# Patient Record
Sex: Female | Born: 1980 | Race: White | Hispanic: No | Marital: Single | Smoking: Current every day smoker
Health system: Southern US, Community
[De-identification: ages and names within clinical notes are randomized; demographics above are authoritative.]

## PROBLEM LIST (undated history)

## (undated) DIAGNOSIS — I10 Essential (primary) hypertension: Secondary | ICD-10-CM

## (undated) DIAGNOSIS — F419 Anxiety disorder, unspecified: Secondary | ICD-10-CM

## (undated) DIAGNOSIS — F32A Depression, unspecified: Secondary | ICD-10-CM

## (undated) DIAGNOSIS — F329 Major depressive disorder, single episode, unspecified: Secondary | ICD-10-CM

## (undated) HISTORY — PX: TUBAL LIGATION: SHX77

---

## 2007-07-23 ENCOUNTER — Ambulatory Visit (HOSPITAL_COMMUNITY): Admission: RE | Admit: 2007-07-23 | Discharge: 2007-07-23 | Payer: Self-pay | Admitting: Obstetrics and Gynecology

## 2007-08-20 ENCOUNTER — Ambulatory Visit (HOSPITAL_COMMUNITY): Admission: RE | Admit: 2007-08-20 | Discharge: 2007-08-20 | Payer: Self-pay | Admitting: Obstetrics and Gynecology

## 2015-07-26 ENCOUNTER — Emergency Department (INDEPENDENT_AMBULATORY_CARE_PROVIDER_SITE_OTHER)
Admission: EM | Admit: 2015-07-26 | Discharge: 2015-07-26 | Disposition: A | Discharge status: Home / Self Care | Payer: Medicaid Other | Source: Home / Self Care | Attending: Family Medicine | Admitting: Family Medicine

## 2015-07-26 ENCOUNTER — Encounter: Payer: Self-pay | Admitting: *Deleted

## 2015-07-26 DIAGNOSIS — R1031 Right lower quadrant pain: Secondary | ICD-10-CM | POA: Diagnosis not present

## 2015-07-26 DIAGNOSIS — R300 Dysuria: Secondary | ICD-10-CM

## 2015-07-26 HISTORY — DX: Depression, unspecified: F32.A

## 2015-07-26 HISTORY — DX: Major depressive disorder, single episode, unspecified: F32.9

## 2015-07-26 HISTORY — DX: Anxiety disorder, unspecified: F41.9

## 2015-07-26 HISTORY — DX: Essential (primary) hypertension: I10

## 2015-07-26 LAB — POCT URINALYSIS DIP (MANUAL ENTRY)
Bilirubin, UA: NEGATIVE
Blood, UA: NEGATIVE
Glucose, UA: NEGATIVE
Ketones, POC UA: NEGATIVE
Leukocytes, UA: NEGATIVE
Nitrite, UA: NEGATIVE
Protein Ur, POC: 100 — AB
Spec Grav, UA: >=1.03 (ref 1.005–1.03)
Urobilinogen, UA: 0.2 (ref 0–1)
pH, UA: 5.5 (ref 5–8)

## 2015-07-26 NOTE — ED Provider Notes (Signed)
Filed Vitals:   07/26/15 1733  BP: 130/86  Pulse: 78  Temp: 98 F (36.7 C)  Resp: 16    Pt is a 35yo female c/o gradually worsening severe RLQ abdominal pain for 3 days.  Pt also reports hx of near-syncope this morning due to the pain but denies hitting head or fully passing out. Pain is aching and sharp, 10/10. Hx of ovarian cysts. She has not taken anything at home for pain as she is short on cash and does not have anything at home to take.  Reports nausea yesterday and waking up with sweats last night. Pt still has her appendix and gallbladder. Denies vaginal symptoms but does c/o dysuria.  UA: unremarkable   On exam, pt is tearful. Tenderness to RLQ.    Concern for appendicitis. No CT or U/S available at this time. Recommend pt go to emergency department for further evaluation. Pt accompanied by friend who feels comfortable transporting [facility]. Declined EMS transport. Pt is stable for transport via POV   Junius Finnerrin O'Malley, PA-C 07/26/15 1741

## 2015-07-26 NOTE — ED Notes (Signed)
Pt c/o RT sided pelvic pain and dysuria x 3 days. She reports an possible episode of LOC this morning. She reports pelvic pain with BM.

## 2016-10-04 ENCOUNTER — Emergency Department
Admission: EM | Admit: 2016-10-04 | Discharge: 2016-10-04 | Disposition: A | Discharge status: Home / Self Care | Payer: Self-pay | Source: Home / Self Care | Attending: Family Medicine | Admitting: Family Medicine

## 2016-10-04 ENCOUNTER — Encounter: Payer: Self-pay | Admitting: *Deleted

## 2016-10-04 DIAGNOSIS — L02411 Cutaneous abscess of right axilla: Secondary | ICD-10-CM

## 2016-10-04 MED ORDER — DOXYCYCLINE HYCLATE 100 MG PO CAPS: 100.0000 mg | ORAL_CAPSULE | Freq: Two times a day (BID) | ORAL | 0 refills | Status: DC

## 2016-10-04 MED ORDER — HYDROCODONE-ACETAMINOPHEN 5-325 MG PO TABS: 1.0000 | ORAL_TABLET | Freq: Four times a day (QID) | ORAL | 0 refills | Status: DC | PRN

## 2016-10-04 MED ORDER — KETOROLAC TROMETHAMINE 60 MG/2ML IM SOLN
60.0000 mg | Freq: Once | INTRAMUSCULAR | Status: AC
  Administered 2016-10-04: 60 mg via INTRAMUSCULAR

## 2016-10-04 NOTE — Discharge Instructions (Signed)
Keep present bandage in place until follow-up visit tomorrow.  Keep bandage clean and dry.  May take Ibuprofen 200mg , 4 tabs every 8 hours with food.

## 2016-10-04 NOTE — ED Triage Notes (Signed)
Patient c/o 3 days of developing abscess to right axilla. Denies drainage.

## 2016-10-04 NOTE — ED Provider Notes (Signed)
[facility]    CSN: [id number] Arrival date & time: 10/04/16  1240     History   Chief Complaint Chief Complaint  Patient presents with  . Abscess    HPI Dawn Avery is a 36 y.o. female.   Patient complains of 3 day history of tender swollen nodule in right axilla.  There has been no drainage.  No fevers, chills, and sweats.   The history is provided by the patient.  Abscess  Abscess location: left axilla. Size:  2cm diameter Abscess quality: fluctuance, painful and redness   Abscess quality: not draining   Duration:  3 days Progression:  Worsening Pain details:    Quality:  Pressure and dull   Severity:  Moderate   Duration:  3 days   Timing:  Constant   Progression:  Worsening Chronicity:  New Context: not skin injury   Relieved by:  Nothing Exacerbated by: arm movement. Ineffective treatments:  None tried Associated symptoms: no anorexia, no fatigue, no fever and no nausea     Past Medical History:  Diagnosis Date  . Anxiety   . Depression   . Hypertension     There are no active problems to display for this patient.   Past Surgical History:  Procedure Laterality Date  . TUBAL LIGATION      OB History    No data available       Home Medications    Prior to Admission medications   Medication Sig Start Date End Date Taking? Authorizing Provider  doxycycline (VIBRAMYCIN) 100 MG capsule Take 1 capsule (100 mg total) by mouth 2 (two) times daily. Take with food. 10/04/16   Lattie HawBeese, Zayden Hahne A, MD  HYDROcodone-acetaminophen (NORCO/VICODIN) 5-325 MG tablet Take 1 tablet by mouth every 6 (six) hours as needed for moderate pain. 10/04/16   Lattie HawBeese, Timmia Cogburn A, MD    Family History Family History  Problem Relation Age of Onset  . Heart disease Father   . Stroke Father   . Diabetes Mother   . Heart failure Mother     Social History Social History  Substance Use Topics  . Smoking status: Current Every Day Smoker    Packs/day: 1.00   Types: Cigarettes  . Smokeless tobacco: Never Used  . Alcohol use No     Allergies   Patient has no known allergies.   Review of Systems Review of Systems  Constitutional: Negative for fatigue and fever.  Gastrointestinal: Negative for anorexia and nausea.  All other systems reviewed and are negative.    Physical Exam Triage Vital Signs ED Triage Vitals [10/04/16 1307]  Enc Vitals Group     BP 126/85     Pulse Rate 68     Resp      Temp 98.7 F (37.1 C)     Temp Source Oral     SpO2 98 %     Weight 125 lb (56.7 kg)     Height      Head Circumference      Peak Flow      Pain Score 7     Pain Loc      Pain Edu?      Excl. in GC?    No data found.   Updated Vital Signs BP 126/85 (BP Location: Left Arm)   Pulse 68   Temp 98.7 F (37.1 C) (Oral)   Wt 125 lb (56.7 kg)   LMP 09/13/2016 (Approximate)   SpO2 98%   BMI 22.86 kg/m  Visual Acuity Right Eye Distance:   Left Eye Distance:   Bilateral Distance:    Right Eye Near:   Left Eye Near:    Bilateral Near:     Physical Exam Nursing notes and Vital Signs reviewed. Appearance:  Patient appears stated age, and in no acute distress.    Eyes:  Pupils are equal, round, and reactive to light and accomodation.  Extraocular movement is intact.  Conjunctivae are not inflamed   Pharynx:  Normal; moist mucous membranes  Neck:  Supple.  No adenopathy Lungs:  Normal inspiration Heart:  Normal rate.   Skin:  Right axilla:  2cm diameter tender, swollen, fluctuant, erythematous cystic lesion.  UC Treatments / Results  Labs (all labs ordered are listed, but only abnormal results are displayed) Labs Reviewed - No data to display  EKG  EKG Interpretation None       Radiology No results found.  Procedures Procedures  Incise and drain cyst/abscess right axilla. Risks and benefits of procedure explained to patient and verbal consent obtained.  Using sterile technique, topical refrigerant spray, and local  anesthesia with 1% lidocaine with epinephrine, cleansed affected area with Betadine and alcohol. Identified the most fluctuant area of lesion and incised with #11 blade.  Expressed blood and purulent material.  Inserted Iodoform gauze packing.  Bandage applied.  Patient tolerated well   Medications Ordered in UC Medications  ketorolac (TORADOL) injection 60 mg (not administered)     Initial Impression / Assessment and Plan / UC Course  I have reviewed the triage vital signs and the nursing notes.  Pertinent labs & imaging results that were available during my care of the patient were reviewed by me and considered in my medical decision making (see chart for details).    Wound culture pending.  Begin doxycycline 100mg  BID for staph coverage. Administered Toradol 60mg  IM  Rx for Lortab (#12, no ref) Keep present bandage in place until follow-up visit tomorrow.  Keep bandage clean and dry.  May take Ibuprofen 200mg , 4 tabs every 8 hours with food.    Controlled Substance Prescriptions I have consulted the [employer] Controlled Substances Registry for this patient, and feel the risk/benefit ratio today is favorable for proceeding with this prescription for a controlled substance.   Final Clinical Impressions(s) / UC Diagnoses   Final diagnoses:  Abscess of axilla, right    New Prescriptions New Prescriptions   DOXYCYCLINE (VIBRAMYCIN) 100 MG CAPSULE    Take 1 capsule (100 mg total) by mouth 2 (two) times daily. Take with food.   HYDROCODONE-ACETAMINOPHEN (NORCO/VICODIN) 5-325 MG TABLET    Take 1 tablet by mouth every 6 (six) hours as needed for moderate pain.     Lattie HawBeese, Elisabella Hacker A, MD 10/14/16 [phone]

## 2016-10-05 ENCOUNTER — Emergency Department (INDEPENDENT_AMBULATORY_CARE_PROVIDER_SITE_OTHER)
Admission: EM | Admit: 2016-10-05 | Discharge: 2016-10-05 | Disposition: A | Discharge status: Home / Self Care | Payer: Self-pay | Source: Home / Self Care | Attending: Family Medicine | Admitting: Family Medicine

## 2016-10-05 ENCOUNTER — Encounter: Payer: Self-pay | Admitting: Emergency Medicine

## 2016-10-05 DIAGNOSIS — Z4801 Encounter for change or removal of surgical wound dressing: Secondary | ICD-10-CM

## 2016-10-05 NOTE — ED Triage Notes (Signed)
Patient presents for wound check to axilla region

## 2016-10-05 NOTE — Discharge Instructions (Signed)
Change dressing daily until healed.  May apply heating pad several times daily.  Finish antibiotic.

## 2016-10-05 NOTE — ED Provider Notes (Signed)
[facility]    CSN: [id number] Arrival date & time: 10/05/16  1617     History   Chief Complaint Chief Complaint  Patient presents with  . Wound Check    HPI Robinette HainesMelissa Avery is a 36 y.o. female.   Patient returns for wound check I and D site right axilla.  She has no complaints.  She reports decreased pain.   The history is provided by the patient.    Past Medical History:  Diagnosis Date  . Anxiety   . Depression   . Hypertension     There are no active problems to display for this patient.   Past Surgical History:  Procedure Laterality Date  . TUBAL LIGATION      OB History    No data available       Home Medications    Prior to Admission medications   Medication Sig Start Date End Date Taking? Authorizing Provider  doxycycline (VIBRAMYCIN) 100 MG capsule Take 1 capsule (100 mg total) by mouth 2 (two) times daily. Take with food. 10/04/16   Lattie HawBeese, Stephen A, MD  HYDROcodone-acetaminophen (NORCO/VICODIN) 5-325 MG tablet Take 1 tablet by mouth every 6 (six) hours as needed for moderate pain. 10/04/16   Lattie HawBeese, Stephen A, MD    Family History Family History  Problem Relation Age of Onset  . Heart disease Father   . Stroke Father   . Diabetes Mother   . Heart failure Mother     Social History Social History  Substance Use Topics  . Smoking status: Current Every Day Smoker    Packs/day: 1.00    Types: Cigarettes  . Smokeless tobacco: Never Used  . Alcohol use No     Allergies   Patient has no known allergies.   Review of Systems Review of Systems  No fevers, chills, and sweats   Physical Exam Triage Vital Signs ED Triage Vitals [10/05/16 1636]  Enc Vitals Group     BP (!) 126/54     Pulse Rate 65     Resp      Temp 98 F (36.7 C)     Temp Source Oral     SpO2 100 %     Weight 125 lb (56.7 kg)     Height 5\' 3"  (1.6 m)     Head Circumference      Peak Flow      Pain Score 6     Pain Loc      Pain Edu?      Excl. in GC?     No data found.   Updated Vital Signs BP (!) 126/54 (BP Location: Left Arm)   Pulse 65   Temp 98 F (36.7 C) (Oral)   Ht 5\' 3"  (1.6 m)   Wt 125 lb (56.7 kg)   LMP 09/13/2016 (Approximate)   SpO2 100%   BMI 22.14 kg/m   Visual Acuity Right Eye Distance:   Left Eye Distance:   Bilateral Distance:    Right Eye Near:   Left Eye Near:    Bilateral Near:     Physical Exam Nursing notes and Vital Signs reviewed. Appearance:  Patient appears stated age, and in no acute distress.    Eyes:  Pupils are equal, round, and reactive to light and accomodation.  Extraocular movement is intact.  Conjunctivae are not inflamed   Neck:  Supple.  No adenopathy Lungs:  Normal respiration. Heart:  Normal rate.   Skin:  Right axilla.  Removed packing from I and D site.  Minimal purulent drainage.  Wound shallow.  Decreased tenderness, and erythema resolved.   UC Treatments / Results  Labs (all labs ordered are listed, but only abnormal results are displayed) Labs Reviewed - No data to display  EKG  EKG Interpretation None       Radiology No results found.  Procedures Procedures (including critical care time)  Medications Ordered in UC Medications - No data to display   Initial Impression / Assessment and Plan / UC Course  I have reviewed the triage vital signs and the nursing notes.  Pertinent labs & imaging results that were available during my care of the patient were reviewed by me and considered in my medical decision making (see chart for details).    No further packing indicated. Bandage applied. Change dressing daily until healed.  May apply heating pad several times daily.  Finish antibiotic. Return for worsening symptoms     Final Clinical Impressions(s) / UC Diagnoses   Final diagnoses:  Dressing change or removal, surgical wound    New Prescriptions New Prescriptions   No medications on file     Lattie HawBeese, Stephen A, MD 10/14/16 1001

## 2016-10-07 ENCOUNTER — Telehealth: Payer: Self-pay

## 2016-10-07 LAB — WOUND CULTURE

## 2016-10-07 NOTE — Telephone Encounter (Signed)
Called pt with lab results and to continue meds as prescribed.  Will follow up as needed.

## 2017-01-21 ENCOUNTER — Encounter: Payer: Self-pay | Admitting: Family Medicine

## 2017-01-21 ENCOUNTER — Ambulatory Visit (INDEPENDENT_AMBULATORY_CARE_PROVIDER_SITE_OTHER): Payer: Self-pay | Admitting: Family Medicine

## 2017-01-21 ENCOUNTER — Ambulatory Visit (INDEPENDENT_AMBULATORY_CARE_PROVIDER_SITE_OTHER): Payer: Self-pay

## 2017-01-21 ENCOUNTER — Other Ambulatory Visit (HOSPITAL_BASED_OUTPATIENT_CLINIC_OR_DEPARTMENT_OTHER): Payer: Self-pay

## 2017-01-21 ENCOUNTER — Ambulatory Visit (HOSPITAL_BASED_OUTPATIENT_CLINIC_OR_DEPARTMENT_OTHER)
Admission: RE | Admit: 2017-01-21 | Discharge: 2017-01-21 | Disposition: A | Discharge status: Home / Self Care | Payer: Self-pay | Source: Ambulatory Visit | Attending: Family Medicine | Admitting: Family Medicine

## 2017-01-21 VITALS — BP 143/94 | HR 80 | Temp 98.5°F | Resp 16 | Wt 126.0 lb

## 2017-01-21 DIAGNOSIS — M79604 Pain in right leg: Secondary | ICD-10-CM

## 2017-01-21 MED ORDER — DICLOFENAC SODIUM 1 % TD GEL: 4.0000 g | Freq: Four times a day (QID) | TRANSDERMAL | 11 refills | Status: AC

## 2017-01-21 MED ORDER — NAPROXEN 500 MG PO TABS: 500.0000 mg | ORAL_TABLET | Freq: Two times a day (BID) | ORAL | 0 refills | Status: AC

## 2017-01-21 MED ORDER — PREDNISONE 5 MG (48) PO TBPK: ORAL_TABLET | ORAL | 0 refills | Status: AC

## 2017-01-21 NOTE — Progress Notes (Signed)
Dawn HainesMelissa Avery is a 36 y.o. female who presents to [facility] today for right leg pain and swelling. Dawn KaufmannMelissa has a two-month history of right low back pain radiating to the right leg. This has been improving recently. She notes the back pain has resolved as has most of the leg pain. She notes a several day history of pain starting at about the anterior proximal tibia radiating to the anterior tibia down to the anterior ankle. She denies any injury or other radiating pain weakness or numbness. She thinks her leg is a bit swollen and is worried about a blood clot. She notes a positive family history for DVT. She denies any chest pain or trouble breathing. She denies fevers or chills vomiting or diarrhea. She's not tried much treatment aside from some over-the-counter medications. She feels well otherwise.   Past Medical History:  Diagnosis Date  . Anxiety   . Depression   . Hypertension    Past Surgical History:  Procedure Laterality Date  . TUBAL LIGATION     Social History   Tobacco Use  . Smoking status: Current Every Day Smoker    Packs/day: 0.50    Types: Cigarettes  . Smokeless tobacco: Never Used  Substance Use Topics  . Alcohol use: No     ROS:  As above   Medications: No current outpatient medications on file.   No current facility-administered medications for this visit.    Allergies  Allergen Reactions  . Cephalexin Rash     Exam:  BP (!) 143/94   Pulse 80   Temp 98.5 F (36.9 C) (Oral)   Resp 16   Wt 126 lb (57.2 kg)   SpO2 100%   BMI 22.32 kg/m  General: Well Developed, well nourished, and in no acute distress.  Neuro/Psych: Alert and oriented x3, extra-ocular muscles intact, able to move all 4 extremities, sensation grossly intact. Skin: Warm and dry, no rashes noted.  Respiratory: Not using accessory muscles, speaking in full sentences, trachea midline.  Cardiovascular: Pulses palpable, no extremity  edema. Abdomen: Does not appear distended. MSK:  Right knee normal-appearing without effusion. No skin change. Tender to palpation anterior and medial knee. Knee range of motion is intact 0-120. Stable ligamentous exam. Negative McMurray's test.  The right calf is normal appearing with no palpable cords or varicose veins. Patient is tender to palpation along the anterior tibia.  She is a small tender nodule that is firm at the medial anterior distal tibia just proximal to the anterior portion of the medial malleolus.     No results found for this or any previous visit (from the past 48 hour(s)). Dg Tibia/fibula Right  Result Date: 01/21/2017 CLINICAL DATA:  Pain and bruising in the right tibia and fibula, no known injury EXAM: RIGHT TIBIA AND FIBULA - 2 VIEW COMPARISON:  None. FINDINGS: No fracture is seen. The right tibia and fibula are in normal alignment. What is visualized of the right knee joint space and right ankle joint appears normal. IMPRESSION: Negative. Electronically Signed   By: Dwyane DeePaul  Barry M.D.   On: 01/21/2017 17:00   [place] Venous Img Lower Unilateral Right  Result Date: 01/21/2017 CLINICAL DATA:  Right lower extremity pain and edema for the past 2 months. History of smoking. Evaluate for DVT EXAM: RIGHT LOWER EXTREMITY VENOUS DOPPLER ULTRASOUND TECHNIQUE: Gray-scale sonography with graded compression, as well as color Doppler and duplex ultrasound were performed to evaluate the lower extremity deep venous systems  from the level of the common femoral vein and including the common femoral, femoral, profunda femoral, popliteal and calf veins including the posterior tibial, peroneal and gastrocnemius veins when visible. The superficial great saphenous vein was also interrogated. Spectral Doppler was utilized to evaluate flow at rest and with distal augmentation maneuvers in the common femoral, femoral and popliteal veins. COMPARISON:  None. FINDINGS: Contralateral Common Femoral  Vein: Respiratory phasicity is normal and symmetric with the symptomatic side. No evidence of thrombus. Normal compressibility. Common Femoral Vein: No evidence of thrombus. Normal compressibility, respiratory phasicity and response to augmentation. Saphenofemoral Junction: No evidence of thrombus. Normal compressibility and flow on color Doppler imaging. Profunda Femoral Vein: No evidence of thrombus. Normal compressibility and flow on color Doppler imaging. Femoral Vein: No evidence of thrombus. Normal compressibility, respiratory phasicity and response to augmentation. Popliteal Vein: No evidence of thrombus. Normal compressibility, respiratory phasicity and response to augmentation. Calf Veins: No evidence of thrombus. Normal compressibility and flow on color Doppler imaging. Superficial Great Saphenous Vein: No evidence of thrombus. Normal compressibility. Venous Reflux:  None. Other Findings: There is a ill-defined punctate (approximately 0.4 x 0.3 x 0.4 cm) isoechoic structure within the subcutaneous tissues which correlates with the patient's palpable area of concern involving the anteromedial aspect the right lower leg. This structure does not definitely communicate with the overlying dermal surface or any adjacent vasculature. IMPRESSION: 1. No evidence of DVT within right lower extremity 2. Punctate (approximately 0.4 cm) isoechoic structure correlates with the patient's palpable area of concern - while indeterminate on this examination, this structure may represent a sebaceous cyst. Clinical correlation is advised. Electronically Signed   By: Simonne ComeJohn  Watts M.D.   On: 01/21/2017 15:24      Assessment and Plan: 36 y.o. female with right leg pain unclear etiology. No obvious fracture or DVT. Symptoms likeley radicular vs knee mensicus issue. Plan for prednisone dose pack followed by naproxen PRN and voltaren gel.  Will recheck in a few weeks.     Orders Placed This Encounter  Procedures  . US  Venous Img Lower Unilateral Right    Standing Status:   Future    Number of Occurrences:   1    Standing Expiration Date:   03/23/2018    Order Specific Question:   Reason for Exam (SYMPTOM  OR DIAGNOSIS REQUIRED)    Answer:   eval right leg swelling    Order Specific Question:   Preferred imaging location?    Answer[facility]  . DG Tibia/Fibula Right    Standing Status:   Future    Number of Occurrences:   1    Standing Expiration Date:   03/23/2018    Order Specific Question:   Reason for Exam (SYMPTOM  OR DIAGNOSIS REQUIRED)    Answer:   eval anterior tib pain    Order Specific Question:   Is patient pregnant?    Answer:   No    Order Specific Question:   Preferred imaging location?    Answer:   [facility]    Order Specific Question:   Radiology Contrast Protocol - do NOT remove file path    Answer:   [email or web address]   No orders of the defined types were placed in this encounter.   Discussed warning signs or symptoms. Please see discharge instructions. Patient expresses understanding.

## 2018-11-03 ENCOUNTER — Emergency Department (HOSPITAL_COMMUNITY)
Admission: EM | Admit: 2018-11-03 | Discharge: 2018-11-03 | Disposition: A | Discharge status: Home / Self Care | Payer: Self-pay | Attending: Emergency Medicine | Admitting: Emergency Medicine

## 2018-11-03 ENCOUNTER — Emergency Department (HOSPITAL_COMMUNITY): Payer: Self-pay

## 2018-11-03 ENCOUNTER — Encounter (HOSPITAL_COMMUNITY): Payer: Self-pay | Admitting: Emergency Medicine

## 2018-11-03 ENCOUNTER — Emergency Department (HOSPITAL_COMMUNITY): Admission: EM | Admit: 2018-11-03 | Discharge: 2018-11-03 | Discharge status: Against Medical Advice | Payer: Self-pay

## 2018-11-03 ENCOUNTER — Other Ambulatory Visit: Payer: Self-pay

## 2018-11-03 DIAGNOSIS — N858 Other specified noninflammatory disorders of uterus: Secondary | ICD-10-CM | POA: Insufficient documentation

## 2018-11-03 DIAGNOSIS — Z79899 Other long term (current) drug therapy: Secondary | ICD-10-CM | POA: Insufficient documentation

## 2018-11-03 DIAGNOSIS — R9389 Abnormal findings on diagnostic imaging of other specified body structures: Secondary | ICD-10-CM

## 2018-11-03 DIAGNOSIS — Z113 Encounter for screening for infections with a predominantly sexual mode of transmission: Secondary | ICD-10-CM | POA: Insufficient documentation

## 2018-11-03 DIAGNOSIS — N730 Acute parametritis and pelvic cellulitis: Secondary | ICD-10-CM

## 2018-11-03 DIAGNOSIS — F17210 Nicotine dependence, cigarettes, uncomplicated: Secondary | ICD-10-CM | POA: Insufficient documentation

## 2018-11-03 DIAGNOSIS — N83519 Torsion of ovary and ovarian pedicle, unspecified side: Secondary | ICD-10-CM

## 2018-11-03 DIAGNOSIS — N739 Female pelvic inflammatory disease, unspecified: Secondary | ICD-10-CM | POA: Insufficient documentation

## 2018-11-03 DIAGNOSIS — R102 Pelvic and perineal pain: Secondary | ICD-10-CM | POA: Insufficient documentation

## 2018-11-03 LAB — CBC
HCT: 42.3 % (ref 36.0–46.0)
Hemoglobin: 13.9 g/dL (ref 12.0–15.0)
MCH: 28.1 pg (ref 26.0–34.0)
MCHC: 32.9 g/dL (ref 30.0–36.0)
MCV: 85.5 fL (ref 80.0–100.0)
Platelets: 234 10*3/uL (ref 150–400)
RBC: 4.95 MIL/uL (ref 3.87–5.11)
RDW: 14.2 % (ref 11.5–15.5)
WBC: 10.6 10*3/uL — ABNORMAL HIGH (ref 4.0–10.5)
nRBC: 0 % (ref 0.0–0.2)

## 2018-11-03 LAB — COMPREHENSIVE METABOLIC PANEL
ALT: 16 U/L (ref 0–44)
AST: 19 U/L (ref 15–41)
Albumin: 4.3 g/dL (ref 3.5–5.0)
Alkaline Phosphatase: 66 U/L (ref 38–126)
Anion gap: 8 (ref 5–15)
BUN: 13 mg/dL (ref 6–20)
CO2: 21 mmol/L — ABNORMAL LOW (ref 22–32)
Calcium: 9.1 mg/dL (ref 8.9–10.3)
Chloride: 108 mmol/L (ref 98–111)
Creatinine, Ser: 0.79 mg/dL (ref 0.44–1.00)
GFR calc Af Amer: >60 mL/min (ref >60)
GFR calc non Af Amer: >60 mL/min (ref >60)
Glucose, Bld: 107 mg/dL — ABNORMAL HIGH (ref 70–99)
Potassium: 3.9 mmol/L (ref 3.5–5.1)
Sodium: 137 mmol/L (ref 135–145)
Total Bilirubin: 0.2 mg/dL — ABNORMAL LOW (ref 0.3–1.2)
Total Protein: 8 g/dL (ref 6.5–8.1)

## 2018-11-03 LAB — URINALYSIS, MICROSCOPIC (REFLEX): WBC, UA: >50 WBC/hpf (ref 0–5)

## 2018-11-03 LAB — HIV ANTIBODY (ROUTINE TESTING W REFLEX): HIV Screen 4th Generation wRfx: NONREACTIVE

## 2018-11-03 LAB — URINALYSIS, ROUTINE W REFLEX MICROSCOPIC
Bilirubin Urine: NEGATIVE
Glucose, UA: NEGATIVE mg/dL
Ketones, ur: NEGATIVE mg/dL
Nitrite: NEGATIVE
Protein, ur: 100 mg/dL — AB
Specific Gravity, Urine: 1.025 (ref 1.005–1.030)
pH: 7 (ref 5.0–8.0)

## 2018-11-03 LAB — I-STAT BETA HCG BLOOD, ED (MC, WL, AP ONLY): I-stat hCG, quantitative: <5 m[IU]/mL (ref <5)

## 2018-11-03 LAB — WET PREP, GENITAL
Sperm: NONE SEEN
Trich, Wet Prep: NONE SEEN
Yeast Wet Prep HPF POC: NONE SEEN

## 2018-11-03 LAB — RPR: RPR Ser Ql: NONREACTIVE

## 2018-11-03 LAB — LIPASE, BLOOD: Lipase: 27 U/L (ref 11–51)

## 2018-11-03 MED ORDER — METRONIDAZOLE 500 MG PO TABS: 500.0000 mg | ORAL_TABLET | Freq: Two times a day (BID) | ORAL | 0 refills | Status: AC

## 2018-11-03 MED ORDER — DOXYCYCLINE HYCLATE 100 MG PO CAPS: 100.0000 mg | ORAL_CAPSULE | Freq: Two times a day (BID) | ORAL | 0 refills | Status: AC

## 2018-11-03 MED ORDER — CEFTRIAXONE SODIUM 250 MG IJ SOLR
250.0000 mg | Freq: Once | INTRAMUSCULAR | Status: AC
  Administered 2018-11-03: 250 mg via INTRAMUSCULAR
  Filled 2018-11-03: qty 250

## 2018-11-03 MED ORDER — ONDANSETRON HCL 4 MG/2ML IJ SOLN
4.0000 mg | Freq: Once | INTRAMUSCULAR | Status: AC
  Administered 2018-11-03: 4 mg via INTRAVENOUS
  Filled 2018-11-03: qty 2

## 2018-11-03 MED ORDER — MORPHINE SULFATE (PF) 4 MG/ML IV SOLN
4.0000 mg | Freq: Once | INTRAVENOUS | Status: AC
  Administered 2018-11-03: 4 mg via INTRAVENOUS
  Filled 2018-11-03: qty 1

## 2018-11-03 MED ORDER — STERILE WATER FOR INJECTION IJ SOLN
INTRAMUSCULAR | Status: AC
  Administered 2018-11-03: 10 mL
  Filled 2018-11-03: qty 10

## 2018-11-03 NOTE — ED Notes (Signed)
No answer for triage x2 

## 2018-11-03 NOTE — Discharge Instructions (Addendum)
You were seen in the ED today for pain in your lower abdomen.  Pelvic exam showed that you have bacterial vaginosis.  It is also expected that you have PID given the amount of discomfort you had during the exam.  We have given you medication to treat gonorrhea in the ED and I have prescribed additional antibiotics for you to treat both PID and bacterial vaginosis.   You may take 600 mg Ibuprofen every 6-8 hours as needed for the pain as well as Tylenol 1,000 mg every 8 hours for the pain.   A pelvic ultrasound was obtained during her ED visit today.  It did show some abnormality in your uterus.  It is recommended that you follow-up with OB/GYN for further evaluation.  [facility].

## 2018-11-03 NOTE — ED Notes (Signed)
Pt called x 1 no answer

## 2018-11-03 NOTE — ED Triage Notes (Signed)
Pt reports lower abd pains that started last night and intensified as the night went on. Pt reports some pusy and bloody urine. Pt reports a hx of kidney stones.

## 2018-11-03 NOTE — ED Provider Notes (Signed)
[facility] EMERGENCY DEPARTMENT Provider Note   CSN: [id number] Arrival date & time: 11/03/18  0730     History   Chief Complaint Chief Complaint  Patient presents with   Abdominal Pain    Lower abd    HPI Dawn Avery is a 38 y.o. female with PMHx anxiety and depression who presents to the ED today noting a sudden onset, constant, achy, suprapubic abdominal pain that began last night and worsened this morning.  Patient reports history of kidney stones and states that this feels similar.  She states that she was in so much pain she was laying in the bathtub and felt like she needed to "push "kidney stone out.  Patient states that she pushed onto her suprapubic area and noticed some bloody mucous in the water.  Has not been taking anything for the pain.  Also endorses nausea but no emesis.  Had tubal ligation proximately 11 years ago.  She is sexually active with one female partner.  Patient denies fever, chills, vomiting, diarrhea, constipation, melena, hematochezia, dysuria, urinary frequency, pelvic pain. LNMP 1 week ago.      Past Medical History:  Diagnosis Date   Anxiety    Depression     There are no active problems to display for this patient.   Past Surgical History:  Procedure Laterality Date   TUBAL LIGATION       OB History   No obstetric history on file.      Home Medications    Prior to Admission medications   Medication Sig Start Date End Date Taking? Authorizing Provider  diclofenac sodium (VOLTAREN) 1 % GEL Apply 4 g topically 4 (four) times daily. To affected joint. 01/21/17   Rodolph Bong, MD  doxycycline (VIBRAMYCIN) 100 MG capsule Take 1 capsule (100 mg total) by mouth 2 (two) times daily for 14 days. 11/03/18 11/17/18  Hyman Hopes, Jafet Wissing, PA-C  metroNIDAZOLE (FLAGYL) 500 MG tablet Take 1 tablet (500 mg total) by mouth 2 (two) times daily. 11/03/18   Hyman Hopes, Nayzeth Altman, PA-C  predniSONE (STERAPRED UNI-PAK 48 TAB) 5 MG (48) TBPK tablet 12  day dosepack po 01/21/17   Rodolph Bong, MD    Family History Family History  Problem Relation Age of Onset   Heart disease Father    Stroke Father    Diabetes Mother    Heart failure Mother     Social History Social History   Tobacco Use   Smoking status: Current Every Day Smoker    Packs/day: 0.50    Types: Cigarettes   Smokeless tobacco: Never Used  Substance Use Topics   Alcohol use: No   Drug use: Yes    Types: Marijuana     Allergies   Cephalexin   Review of Systems Review of Systems  Constitutional: Negative for chills and fever.  HENT: Negative for congestion.   Eyes: Negative for visual disturbance.  Respiratory: Negative for cough and shortness of breath.   Cardiovascular: Negative for chest pain.  Gastrointestinal: Positive for abdominal pain and nausea. Negative for constipation, diarrhea and vomiting.  Genitourinary: Positive for vaginal discharge. Negative for difficulty urinating, dysuria, frequency and pelvic pain.  Musculoskeletal: Negative for myalgias.  Skin: Negative for rash.  Neurological: Negative for headaches.     Physical Exam Updated Vital Signs BP (!) 174/113 (BP Location: Right Arm)    Pulse 93    Temp 98 F (36.7 C) (Oral)    Resp 18    Ht 5\' 2"  (  1.575 m)    Wt 59 kg    LMP 10/28/2018 (Exact Date)    SpO2 97%    BMI 23.78 kg/m   Physical Exam Vitals signs and nursing note reviewed.  Constitutional:      Appearance: She is not ill-appearing.     Comments: Uncomfortable appearing female. Writhing around in bed due to pain  HENT:     Head: Normocephalic and atraumatic.  Eyes:     Conjunctiva/sclera: Conjunctivae normal.  Neck:     Musculoskeletal: Neck supple.  Cardiovascular:     Rate and Rhythm: Normal rate and regular rhythm.  Pulmonary:     Effort: Pulmonary effort is normal.     Breath sounds: Normal breath sounds.  Abdominal:     Palpations: Abdomen is soft.     Tenderness: There is abdominal tenderness in  the suprapubic area. There is no right CVA tenderness, left CVA tenderness, guarding or rebound.     Comments: Soft, tenderness to suprapubic area as well as RLQ/LLQ without Mcburney's sign, +BS throughout, no r/g/r, neg murphy's, no CVA TTP   Genitourinary:    Comments: Chaperone present for exam. No rashes, lesions, or tenderness to external genitalia. No erythema, injury, or tenderness to vaginal mucosa. Thin white discharge to vaginal vault without bleeding. + Right adnexal and CMT tenderness on exam.  Cervical os is closed. Uterus non-deviated, mobile, nonTTP, and without enlargement.   Skin:    General: Skin is warm and dry.  Neurological:     Mental Status: She is alert.      ED Treatments / Results  Labs (all labs ordered are listed, but only abnormal results are displayed) Labs Reviewed  WET PREP, GENITAL - Abnormal; Notable for the following components:      Result Value   Clue Cells Wet Prep HPF POC PRESENT (*)    WBC, Wet Prep HPF POC MANY (*)    All other components within normal limits  COMPREHENSIVE METABOLIC PANEL - Abnormal; Notable for the following components:   CO2 21 (*)    Glucose, Bld 107 (*)    Total Bilirubin 0.2 (*)    All other components within normal limits  CBC - Abnormal; Notable for the following components:   WBC 10.6 (*)    All other components within normal limits  URINALYSIS, ROUTINE W REFLEX MICROSCOPIC - Abnormal; Notable for the following components:   APPearance TURBID (*)    Hgb urine dipstick LARGE (*)    Protein, ur 100 (*)    Leukocytes,Ua MODERATE (*)    All other components within normal limits  URINALYSIS, MICROSCOPIC (REFLEX) - Abnormal; Notable for the following components:   Bacteria, UA MANY (*)    All other components within normal limits  URINE CULTURE  LIPASE, BLOOD  RPR  HIV ANTIBODY (ROUTINE TESTING W REFLEX)  I-STAT BETA HCG BLOOD, ED ([facility], [facility], [facility] ONLY)  I-STAT BETA HCG BLOOD, ED ([facility], [facility], [facility] ONLY)  GC/CHLAMYDIA  PROBE AMP ([place]) NOT AT [facility]    EKG None  Radiology Ct Renal Stone Study  Result Date: 11/03/2018 CLINICAL DATA:  Anterior abdominal pain EXAM: CT ABDOMEN AND PELVIS WITHOUT CONTRAST TECHNIQUE: Multidetector CT imaging of the abdomen and pelvis was performed following the standard protocol without IV contrast. COMPARISON:  12/16/2016 FINDINGS: Lower chest: No acute abnormality. Hepatobiliary: No focal liver abnormality is seen. No gallstones, gallbladder wall thickening, or biliary dilatation. Pancreas: Unremarkable. No pancreatic ductal dilatation or surrounding inflammatory changes. Spleen: Normal in  size without focal abnormality. Adrenals/Urinary Tract: Adrenal glands are unremarkable. Kidneys are normal, without renal calculi, focal lesion, or hydronephrosis. Bladder is unremarkable. Stomach/Bowel: Stomach is within normal limits. Appendix appears normal. No evidence of bowel wall thickening, distention, or inflammatory changes. Vascular/Lymphatic: Aortic atherosclerosis. No enlarged abdominal or pelvic lymph nodes. Reproductive: Uterus and bilateral adnexa are unremarkable. Other: No abdominal wall hernia or abnormality. No abdominopelvic ascites. Musculoskeletal: No acute or significant osseous findings. IMPRESSION: No acute abnormality noted. No significant interval change from the prior exam. Electronically Signed   By: Inez Catalina M.D.   On: 11/03/2018 12:56   US Pelvic Complete W Transvaginal And Torsion R/o  Result Date: 11/03/2018 CLINICAL DATA:  Pelvic pain. EXAM: TRANSABDOMINAL AND TRANSVAGINAL ULTRASOUND OF PELVIS DOPPLER ULTRASOUND OF OVARIES TECHNIQUE: Both transabdominal and transvaginal ultrasound examinations of the pelvis were performed. Transabdominal technique was performed for global imaging of the pelvis including uterus, ovaries, adnexal regions, and pelvic cul-de-sac. It was necessary to proceed with endovaginal exam following the transabdominal exam to visualize the  uterus and ovaries. COMPARISON:  CT report 10/20/2016.  Ultrasound 08/20/2007. FINDINGS: Uterus Measurements: 8.2 x 4.2 x 4.5 cm = volume: 80.5 mL. No fibroids or other mass visualized. Endometrium Thickness: 9.0. 1.6 cm echogenic focus noted in the endometrial canal. Focal endometrial pathology including endometrial malignancy cannot be excluded. Right ovary Measurements: 3.3 x 2.5 x 2.3 cm = volume: 9 point mL. Normal appearance/no adnexal mass. Left ovary Measurements: 2.5 x 2.1 x 2.8 cm = volume: 7.5 mL. Normal appearance/no adnexal mass. Pulsed Doppler evaluation of both ovaries demonstrates normal low-resistance arterial and venous waveforms. Other findings No abnormal free fluid. IMPRESSION: 1.6 cm echogenic focus noted in the endometrial canal. Focal endometrial pathology including endometrial malignancy cannot be excluded. A focal endometrial lesion is suspected. Consider sonohysterogram for further evaluation, prior to hysteroscopy or endometrial biopsy. Electronically Signed   By: Marcello Moores  Register   On: 11/03/2018 10:30    Procedures Procedures (including critical care time)  Medications Ordered in ED Medications  morphine 4 MG/ML injection 4 mg (4 mg Intravenous Given 11/03/18 0832)  ondansetron (ZOFRAN) injection 4 mg (4 mg Intravenous Given 11/03/18 0832)  cefTRIAXone (ROCEPHIN) injection 250 mg (250 mg Intramuscular Given 11/03/18 1315)  sterile water (preservative free) injection (10 mLs  Given 11/03/18 1318)     Initial Impression / Assessment and Plan / ED Course  I have reviewed the triage vital signs and the nursing notes.  Pertinent labs & imaging results that were available during my care of the patient were reviewed by me and considered in my medical decision making (see chart for details).  Clinical Course as of Nov 02 1417  Tue Nov 03, 2018  1013 Clue Cells Wet Prep HPF POC(!): PRESENT [MV]  1013 WBC(!): 10.6 [MV]    Clinical Course User Index [MV] Dawn Avery, [place]    38 year old female who presents with suprapubic abdominal pain and questionable vaginal discharge that began last night.  Reports history of kidney stones and states this feels similar.  Patient has no CVA tenderness on exam.  She does have some tenderness to the right lower and left lower quadrants but without mcburney's point tenderness.  Seems extremely uncomfortable on exam.  Will give pain medicine and nausea medicine at this time.  Baseline labs ordered as well.  Patient will be pelvic exam today.  She will likely also need pelvic ultrasound.  If no obvious findings will proceed with CT scan today.   Pelvic  exam with right adnexal and CMT tenderness.  Patient's urinalysis with leuks and red blood cells.  There was no obvious vaginal bleeding during pelvic exam.  Will obtain ultrasound at this time to rule out PID versus TOA.  If nonconclusive findings will proceed with CT scan.  Ultrasound with echogenic mass in the endometrium.  Discussed case with OB/GYN on-call who suggested the patient can be seen outpatient for this.  Patient reports that she has not had a Pap smear in quite some time.  Will give referral at time of discharge for further follow-up.  I am concerned about PID at this time given patient's CMT tenderness.  She has female partner in the room who is unwilling to leave.  She states she is only monogamous with him but cannot talk to her further for more information.  Regardless we will treat outpatient with doxycycline.  She has BV on wet prep.  Will treat as well.  Will order ceftriaxone here.  Patient states that when she has had kidney stones in the past she has had symptoms similar to this.  Given she has infection on her urine as well as blood will obtain CT renal stone study at this time.  Would like to rule out infected kidney stone.   CT scan without any acute findings.  Question whether blood in urine is from area we are seeing on the ultrasound today.  Regardless we will treat  patient for PID.  Boyfriend has been out of the room for some time.  She reports that they recently got together and he had not gotten tested prior to them having intercourse.  Advised that he will need to be tested and treated as well.  She reports that she has been treated for gonorrhea in the past and has actually had PID a couple of years ago.  She has had no reaction to Rocephin despite Keflex allergy.  Give this in the ED today as well and sent home with Doxy as well as Flagyl for BV.  Patient to be given OB/GYN follow-up in regards to the endometrial mass.   Initially place consult to case management.  Patient was interested in starting Medicaid application.  Forward she reports she has some contacts that can help her apply for this does not need to speak to case management anymore.  Will discharge at this time.  Strict return precautions discussed with patient.  She is in agreement with plan at this time stable for discharge home.  This note was prepared using Dragon voice recognition software and may include unintentional dictation errors due to the inherent limitations of voice recognition software.       Final Clinical Impressions(s) / ED Diagnoses   Final diagnoses:  PID (acute pelvic inflammatory disease)  Abnormal pelvic ultrasound    ED Discharge Orders         Ordered    doxycycline (VIBRAMYCIN) 100 MG capsule  2 times daily     11/03/18 1418    metroNIDAZOLE (FLAGYL) 500 MG tablet  2 times daily     11/03/18 1418           Tanda RockersVenter, Nik Gorrell, PA-C 11/03/18 1419    Tegeler, Canary Brimhristopher J, MD 11/03/18 [phone]

## 2018-11-03 NOTE — ED Notes (Addendum)
Pt complaining of abdominal pain that started yesterday. Pt stated "my uterus hurts." Pt is tearful. Pt stated that she sat in warm water and that helped.

## 2018-11-03 NOTE — Discharge Planning (Signed)
[facility] consulted regarding Medicaid application.  Pt will have to complete the application at Department of Social Services.  Will provide address and phone number on after visit summary.

## 2018-11-03 NOTE — Discharge Planning (Signed)
[facility] met with pt at bedside.  EDCM advised pt to visit DSS to complete Medicaid application.  Pt states she will contact the DSS office promptly upon discharge.

## 2018-11-04 LAB — GC/CHLAMYDIA PROBE AMP (~~LOC~~) NOT AT ARMC
Chlamydia: NEGATIVE
Neisseria Gonorrhea: NEGATIVE

## 2018-11-05 LAB — URINE CULTURE: Culture: >=100000 — AB

## 2018-11-06 ENCOUNTER — Telehealth: Payer: Self-pay

## 2018-11-06 NOTE — Telephone Encounter (Signed)
Post ED Visit - Positive Culture Follow-up: Unsuccessful Patient Follow-up  Culture assessed and recommendations reviewed by:  []  Elenor Quinones, Pharm.D. []  Heide Guile, Pharm.D., BCPS AQ-ID []  Parks Neptune, Pharm.D., BCPS []  Alycia Rossetti, Pharm.D., BCPS []  [place], Pharm.D., BCPS, AAHIVP []  Legrand Como, Pharm.D., BCPS, AAHIVP []  Wynell Balloon, PharmD []  Vincenza Hews, PharmD, BCPS  Positive urine culture  symptom check  May need BactrimDS 1 PO BID x 3 days []  Patient discharged without antimicrobial prescription and treatment is now indicated [x]  Organism is resistant to prescribed ED discharge antimicrobial []  Patient with positive blood cultures   Unable to contact patient after 3 attempts, letter will be sent to address on file  Genia Del 11/06/2018, 9:54 AM

## 2020-06-15 IMAGING — CT CT RENAL STONE PROTOCOL
2 of 4 series · 17 of 46 positions shown, 19 images · non-contrast
Comparison: 12/16/2016

CLINICAL DATA: Anterior abdominal pain

EXAM:
CT ABDOMEN AND PELVIS WITHOUT CONTRAST
TECHNIQUE: Multidetector CT imaging of the abdomen and pelvis was performed
following the standard protocol without IV contrast.

[Series 3: renal stone 5.0 · axial · 0.73mm/px · z∈[+821,+1226]mm · 14 of 89 slices shown, 16 images]
[im 4/89  soft-tissue]
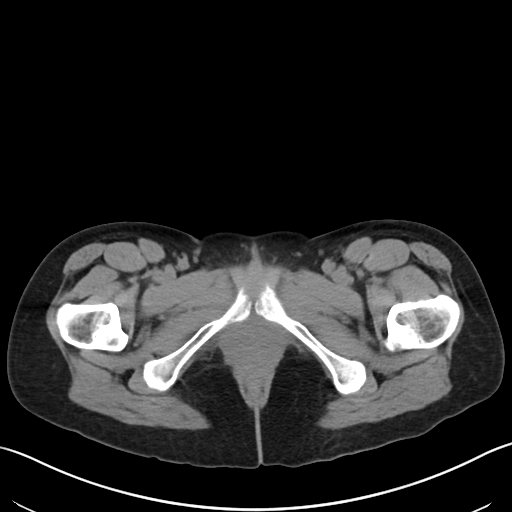
[im 4/89  bone]
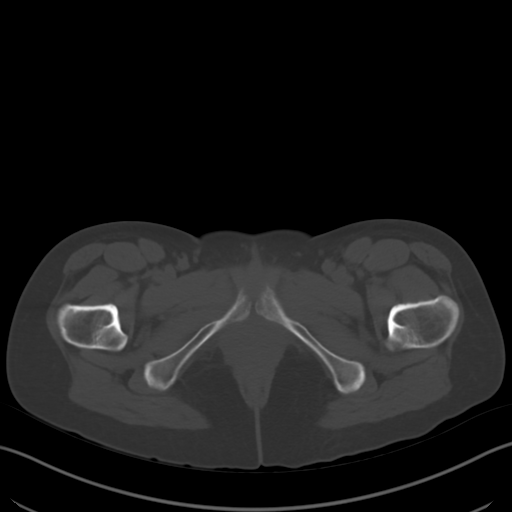
[im 12/89  soft-tissue]
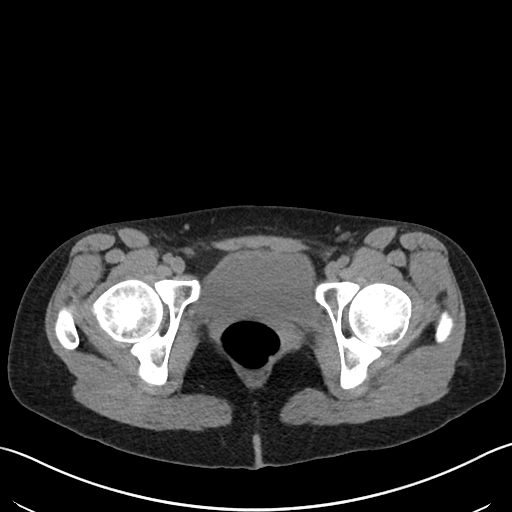
[im 19/89  soft-tissue]
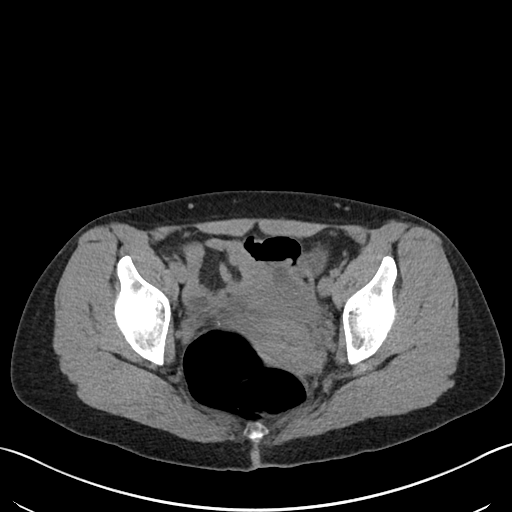
[im 23/89  soft-tissue]
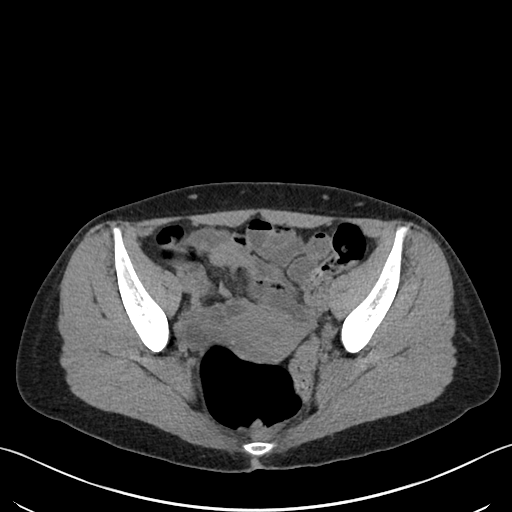
[im 30/89  soft-tissue]
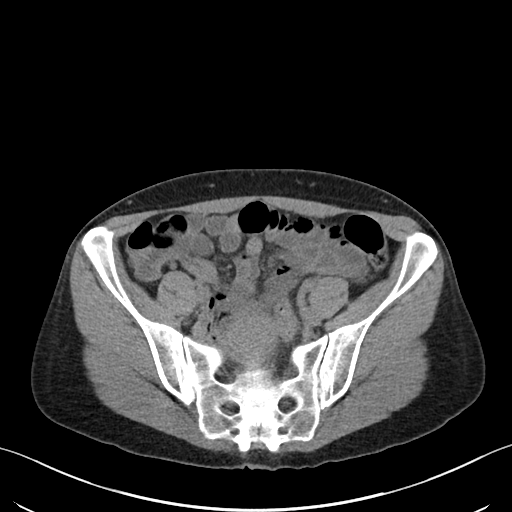
[im 37/89  soft-tissue]
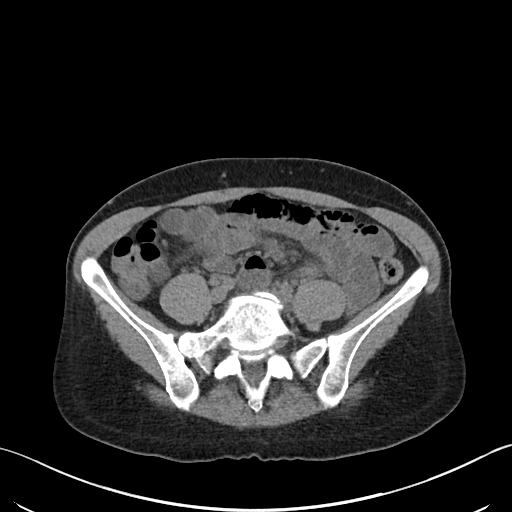
[im 41/89  soft-tissue]
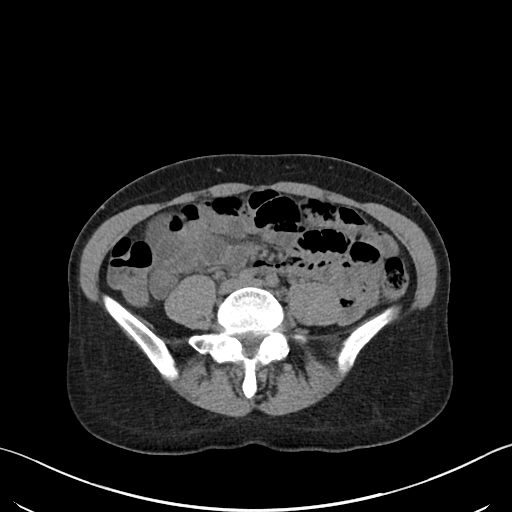
[im 48/89  soft-tissue]
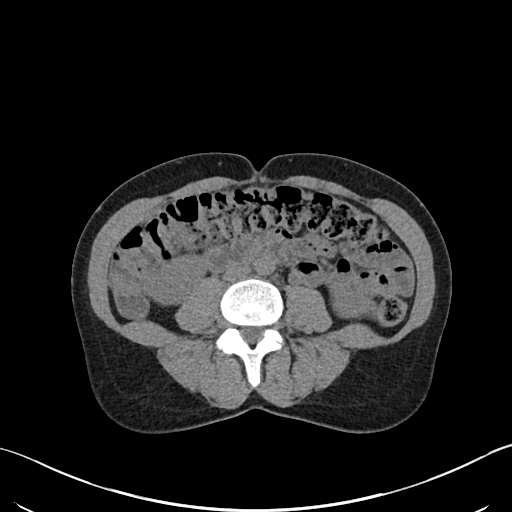
[im 52/89  soft-tissue]
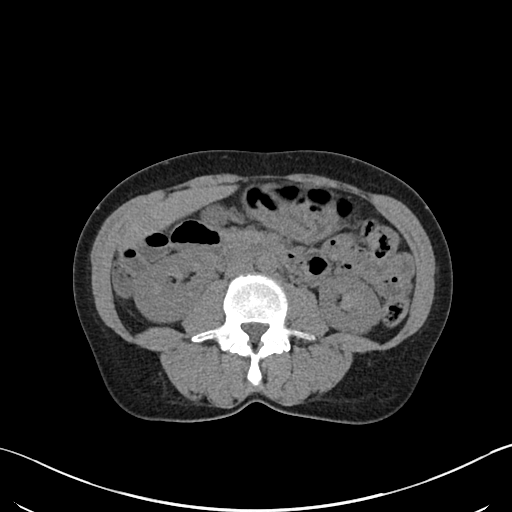
[im 52/89  bone]
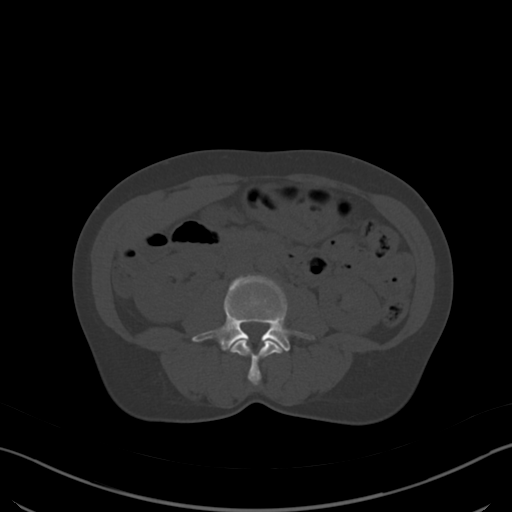
[im 59/89  soft-tissue]
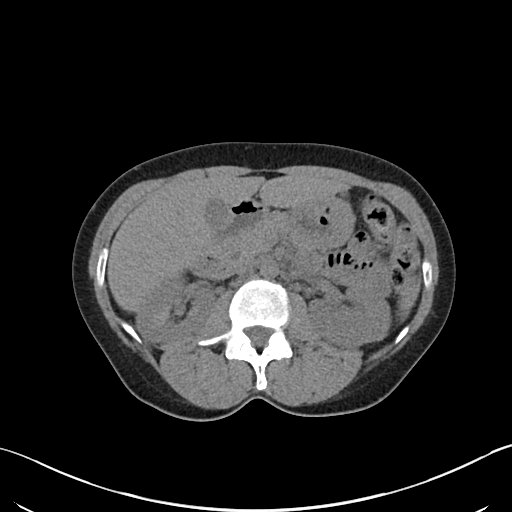
[im 67/89  soft-tissue]
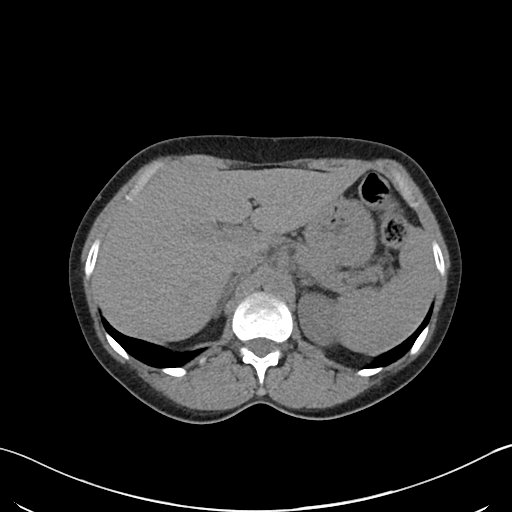
[im 70/89  soft-tissue]
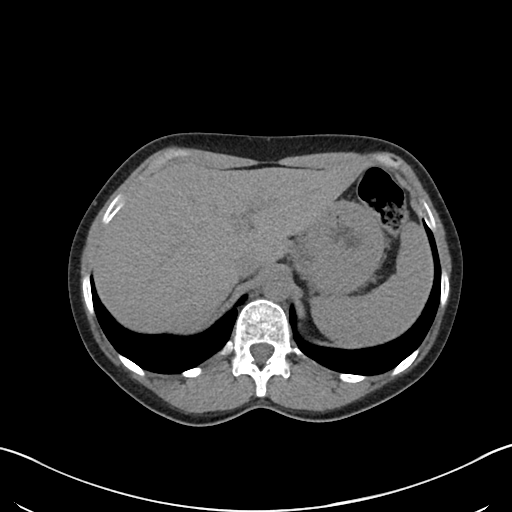
[im 78/89  soft-tissue]
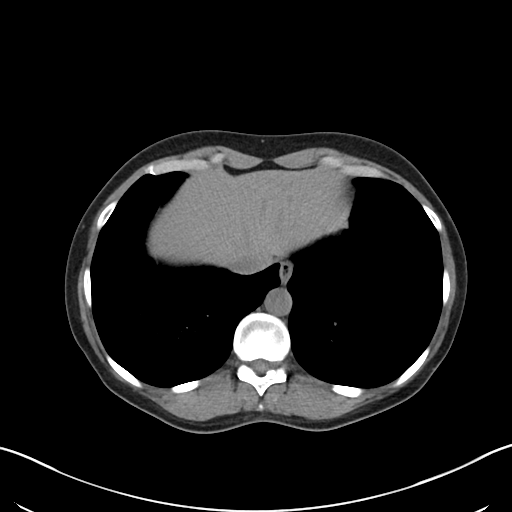
[im 85/89  soft-tissue]
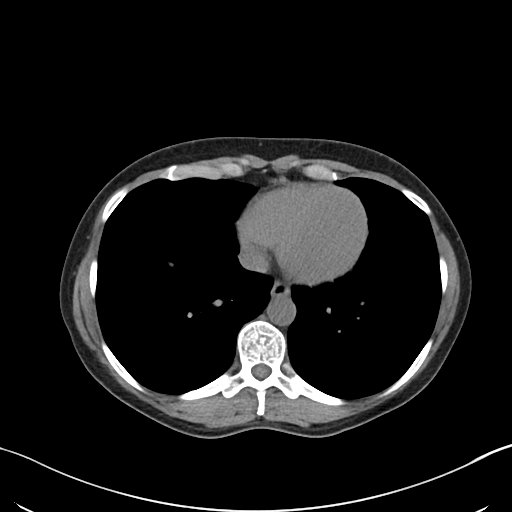

[Series 5: renal stone 3.0 cor · coronal · 0.86mm/px · 3 of 101 slices shown]
[im 45/101  soft-tissue]
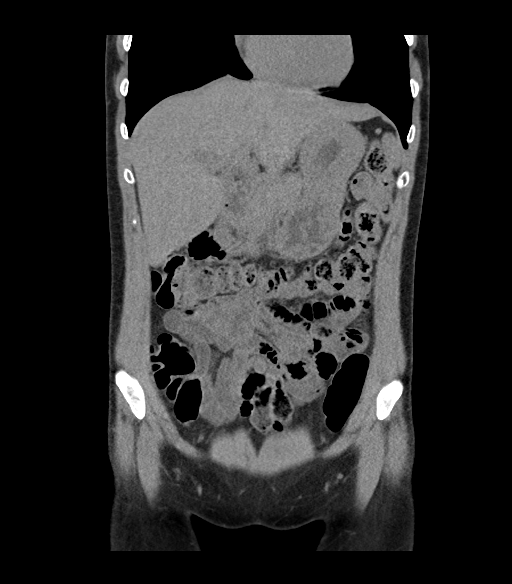
[im 56/101  soft-tissue]
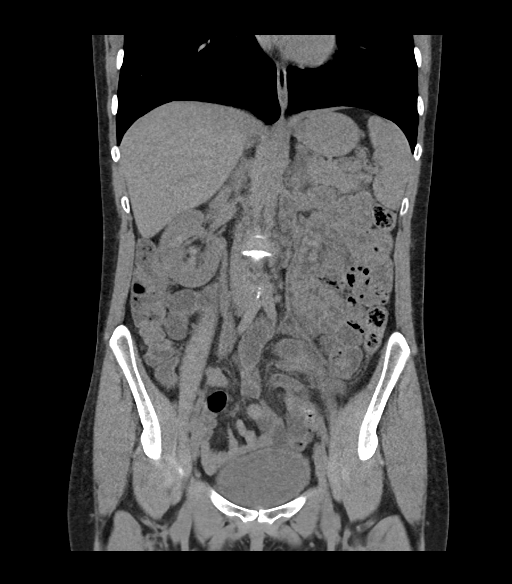
[im 67/101  soft-tissue]
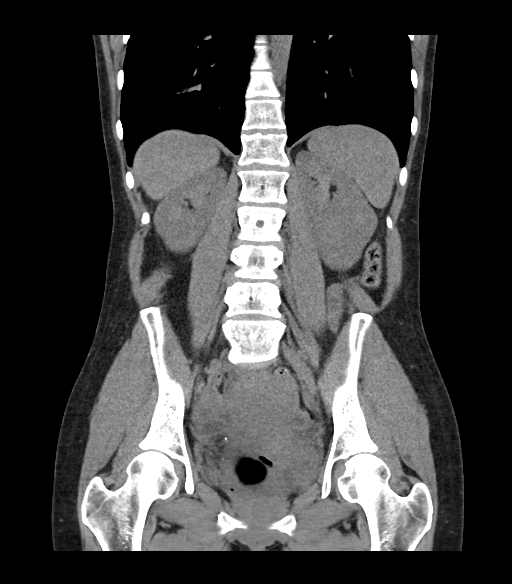

[17 of 46 positions shown; findings below may reference images not displayed]

FINDINGS: Lower chest: No acute abnormality.

Hepatobiliary: No focal liver abnormality is seen. No gallstones,
gallbladder wall thickening, or biliary dilatation.

Pancreas: Unremarkable. No pancreatic ductal dilatation or
surrounding inflammatory changes.

Spleen: Normal in size without focal abnormality.

Adrenals/Urinary Tract: Adrenal glands are unremarkable. Kidneys are
normal, without renal calculi, focal lesion, or hydronephrosis.
Bladder is unremarkable.

Stomach/Bowel: Stomach is within normal limits. Appendix appears
normal. No evidence of bowel wall thickening, distention, or
inflammatory changes.

Vascular/Lymphatic: Aortic atherosclerosis. No enlarged abdominal or
pelvic lymph nodes.

Reproductive: Uterus and bilateral adnexa are unremarkable.

Other: No abdominal wall hernia or abnormality. No abdominopelvic
ascites.

Musculoskeletal: No acute or significant osseous findings.
IMPRESSION: No acute abnormality noted. No significant interval change from the
prior exam.
